# Patient Record
Sex: Female | Born: 1995 | Race: White | Hispanic: No | Smoking: Former smoker
Health system: Southern US, Community
[De-identification: ages and names within clinical notes are randomized; demographics above are authoritative.]

## PROBLEM LIST (undated history)

## (undated) HISTORY — PX: ABDOMINAL SURGERY: SHX537

---
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
## 2014-10-17 ENCOUNTER — Emergency Department: Payer: Self-pay | Admitting: Emergency Medicine

## 2015-03-25 ENCOUNTER — Encounter (HOSPITAL_BASED_OUTPATIENT_CLINIC_OR_DEPARTMENT_OTHER): Payer: Self-pay | Admitting: Emergency Medicine

## 2015-03-25 ENCOUNTER — Emergency Department (HOSPITAL_BASED_OUTPATIENT_CLINIC_OR_DEPARTMENT_OTHER)
Admission: EM | Admit: 2015-03-25 | Discharge: 2015-03-25 | Disposition: A | Discharge status: Home / Self Care | Payer: Medicaid Other | Attending: Emergency Medicine | Admitting: Emergency Medicine

## 2015-03-25 DIAGNOSIS — Z720 Tobacco use: Secondary | ICD-10-CM | POA: Diagnosis not present

## 2015-03-25 DIAGNOSIS — Z711 Person with feared health complaint in whom no diagnosis is made: Secondary | ICD-10-CM

## 2015-03-25 DIAGNOSIS — R300 Dysuria: Secondary | ICD-10-CM | POA: Diagnosis present

## 2015-03-25 DIAGNOSIS — B373 Candidiasis of vulva and vagina: Secondary | ICD-10-CM | POA: Insufficient documentation

## 2015-03-25 DIAGNOSIS — B3731 Acute candidiasis of vulva and vagina: Secondary | ICD-10-CM

## 2015-03-25 DIAGNOSIS — Z113 Encounter for screening for infections with a predominantly sexual mode of transmission: Secondary | ICD-10-CM | POA: Diagnosis not present

## 2015-03-25 LAB — URINE MICROSCOPIC-ADD ON

## 2015-03-25 LAB — WET PREP, GENITAL: Trich, Wet Prep: NONE SEEN

## 2015-03-25 LAB — URINALYSIS, ROUTINE W REFLEX MICROSCOPIC
GLUCOSE, UA: NEGATIVE mg/dL
Hgb urine dipstick: NEGATIVE
Ketones, ur: 15 mg/dL — AB
Nitrite: NEGATIVE
Protein, ur: NEGATIVE mg/dL
SPECIFIC GRAVITY, URINE: 1.028 (ref 1.005–1.030)
UROBILINOGEN UA: 1 mg/dL (ref 0.0–1.0)
pH: 6 (ref 5.0–8.0)

## 2015-03-25 LAB — PREGNANCY, URINE: PREG TEST UR: NEGATIVE

## 2015-03-25 MED ORDER — FLUCONAZOLE 50 MG PO TABS
150.0000 mg | ORAL_TABLET | Freq: Once | ORAL | Status: AC
  Administered 2015-03-25: 150 mg via ORAL
  Filled 2015-03-25 (×2): qty 1

## 2015-03-25 NOTE — ED Provider Notes (Signed)
CSN: [id number]     Arrival date & time 03/25/15  1815 History   First MD Initiated Contact with Patient 03/25/15 1935     Chief Complaint  Patient presents with  . Dysuria     (Consider location/radiation/quality/duration/timing/severity/associated sxs/prior Treatment) HPI  Pt is a [age]o female presenting to ED with c/o vaginal discharge and irritation that started about 4 days ago. Pt states discharge is thick white and "clumpy," moderate amount. States she thinks she has a yeast infection. Hx of same. Denies vaginal pain or bleeding. Denies urinary symptoms. Denies fever, chills, n/v/d. Denies abdominal pain. Pt states she would like to be tested for STDs while she is here. LMP: 02/23/15  History reviewed. No pertinent past medical history. History reviewed. No pertinent past surgical history. History reviewed. No pertinent family history. History  Substance Use Topics  . Smoking status: Current Every Day Smoker -- 0.50 packs/day    Types: Cigarettes  . Smokeless tobacco: Not on file  . Alcohol Use: No   OB History    No data available     Review of Systems  Constitutional: Negative for fever, chills, appetite change and fatigue.  Respiratory: Negative for cough and shortness of breath.   Gastrointestinal: Negative for nausea, vomiting, abdominal pain and diarrhea.  Genitourinary: Positive for vaginal discharge and vaginal pain ( "irritation"). Negative for dysuria, urgency, frequency, hematuria, flank pain, decreased urine volume, vaginal bleeding, genital sores, menstrual problem and pelvic pain.  Skin: Negative for rash.  All other systems reviewed and are negative.     Allergies  Review of patient's allergies indicates no known allergies.  Home Medications   Prior to Admission medications   Not on File   BP 121/70 mmHg  Pulse 78  Temp(Src) 97.9 F (36.6 C) (Oral)  Resp 16  Ht 4\' 11"  (1.499 m)  Wt 101 lb (45.813 kg)  BMI 20.39 kg/m2  SpO2 99%  LMP  02/23/2015 Physical Exam  Constitutional: She appears well-developed and well-nourished. No distress.  HENT:  Head: Normocephalic and atraumatic.  Eyes: Conjunctivae are normal. No scleral icterus.  Neck: Normal range of motion.  Cardiovascular: Normal rate, regular rhythm and normal heart sounds.   Pulmonary/Chest: Effort normal and breath sounds normal. No respiratory distress. She has no wheezes. She has no rales. She exhibits no tenderness.  Abdominal: Soft. Bowel sounds are normal. She exhibits no distension and no mass. There is no tenderness. There is no rebound and no guarding.  Genitourinary: There is no rash, tenderness, lesion or injury on the right labia. There is no rash, tenderness, lesion or injury on the left labia. Cervix exhibits discharge. Cervix exhibits no motion tenderness and no friability. Right adnexum displays no mass, no tenderness and no fullness. Left adnexum displays no mass, no tenderness and no fullness. No erythema, tenderness or bleeding in the vagina. No foreign body around the vagina. No signs of injury around the vagina. Vaginal discharge found.  Chaperoned exam. Normal external genitalia. Vaginal canal: moderate amount thick white-yellow discharge. No vaginal bleeding. No CMT, adnexal tenderness or masses.  Musculoskeletal: Normal range of motion.  Neurological: She is alert.  Skin: Skin is warm and dry. She is not diaphoretic.  Nursing note and vitals reviewed.   ED Course  Procedures (including critical care time) Labs Review Labs Reviewed  WET PREP, GENITAL - Abnormal; Notable for the following:    Yeast Wet Prep HPF POC MODERATE (*)    Clue Cells Wet Prep HPF POC FEW (*)  WBC, Wet Prep HPF POC TOO NUMEROUS TO COUNT (*)    All other components within normal limits  URINALYSIS, ROUTINE W REFLEX MICROSCOPIC (NOT AT [facility]) - Abnormal; Notable for the following:    APPearance CLOUDY (*)    Bilirubin Urine SMALL (*)    Ketones, ur 15 (*)     Leukocytes, UA LARGE (*)    All other components within normal limits  URINE MICROSCOPIC-ADD ON - Abnormal; Notable for the following:    Squamous Epithelial / LPF FEW (*)    Bacteria, UA FEW (*)    All other components within normal limits  URINE CULTURE  PREGNANCY, URINE  HIV ANTIBODY (ROUTINE TESTING)  RPR  GC/CHLAMYDIA PROBE AMP ([place]) NOT AT [facility]    Imaging Review No results found.   EKG Interpretation None      MDM   Final diagnoses:  Vaginal yeast infection  Concern about STD in female without diagnosis    Pt c/o vaginal discharge. Labs c/w yeast infection. Will tx with 1 time dose of diflucan in ED.  Urine culture, GC/Chlamydia, HIV and RPR tests sent off. Advised pt results should come back in 3-4 days, they may also be seen on her MyChart. Home care instructions provided. Return precautions provided. Pt verbalized understanding and agreement with tx plan.     Junius Finner, PA-C 03/26/15 1503  Nelva Nay, MD 03/31/15 2155

## 2015-03-25 NOTE — ED Notes (Signed)
Pt in with c/o likely yeast infection. States white "clumpy" discharge, no lower abdominal pain.

## 2015-03-26 LAB — GC/CHLAMYDIA PROBE AMP (~~LOC~~) NOT AT ARMC
Chlamydia: POSITIVE — AB
Neisseria Gonorrhea: NEGATIVE

## 2015-03-26 LAB — URINE CULTURE
Colony Count: NO GROWTH
Culture: NO GROWTH

## 2015-03-27 LAB — HIV ANTIBODY (ROUTINE TESTING W REFLEX): HIV Screen 4th Generation wRfx: NONREACTIVE

## 2015-03-27 LAB — RPR: RPR Ser Ql: NONREACTIVE

## 2015-03-29 ENCOUNTER — Telehealth: Payer: Self-pay | Admitting: Emergency Medicine

## 2015-03-29 NOTE — Telephone Encounter (Signed)
Post ED Visit - Positive Culture Follow-up: Successful Patient Follow-Up  Positive Chlamydia culture  [x]  Patient discharged without antimicrobial prescription and treatment is now indicated []  Organism is resistant to prescribed ED discharge antimicrobial []  Patient with positive blood cultures  Changes discussed with ED provider: Ethelda ChickJacubowitz, MD New antibiotic prescription: Zithromax one gram PO x once [phone]  Contacted patient, date 03/29/15, time 1630   Jiles HaroldGammons, Lewanda Perea Chaney 03/29/2015, 4:49 PM

## 2015-09-03 ENCOUNTER — Emergency Department
Admission: EM | Admit: 2015-09-03 | Discharge: 2015-09-03 | Disposition: A | Discharge status: Home / Self Care | Payer: Medicaid Other | Attending: Emergency Medicine | Admitting: Emergency Medicine

## 2015-09-03 ENCOUNTER — Encounter: Payer: Self-pay | Admitting: Emergency Medicine

## 2015-09-03 DIAGNOSIS — Z202 Contact with and (suspected) exposure to infections with a predominantly sexual mode of transmission: Secondary | ICD-10-CM | POA: Diagnosis not present

## 2015-09-03 DIAGNOSIS — A599 Trichomoniasis, unspecified: Secondary | ICD-10-CM | POA: Insufficient documentation

## 2015-09-03 DIAGNOSIS — Z3202 Encounter for pregnancy test, result negative: Secondary | ICD-10-CM | POA: Insufficient documentation

## 2015-09-03 DIAGNOSIS — A749 Chlamydial infection, unspecified: Secondary | ICD-10-CM | POA: Insufficient documentation

## 2015-09-03 DIAGNOSIS — N898 Other specified noninflammatory disorders of vagina: Secondary | ICD-10-CM | POA: Diagnosis present

## 2015-09-03 DIAGNOSIS — N390 Urinary tract infection, site not specified: Secondary | ICD-10-CM | POA: Insufficient documentation

## 2015-09-03 DIAGNOSIS — Z720 Tobacco use: Secondary | ICD-10-CM | POA: Diagnosis not present

## 2015-09-03 DIAGNOSIS — A549 Gonococcal infection, unspecified: Secondary | ICD-10-CM | POA: Diagnosis not present

## 2015-09-03 LAB — URINALYSIS COMPLETE WITH MICROSCOPIC (ARMC ONLY)
Bilirubin Urine: NEGATIVE
GLUCOSE, UA: NEGATIVE mg/dL
Hgb urine dipstick: NEGATIVE
Ketones, ur: NEGATIVE mg/dL
NITRITE: NEGATIVE
Protein, ur: NEGATIVE mg/dL
Specific Gravity, Urine: 1.01 (ref 1.005–1.030)
pH: 6 (ref 5.0–8.0)

## 2015-09-03 LAB — WET PREP, GENITAL
Clue Cells Wet Prep HPF POC: NONE SEEN
TRICH WET PREP: NONE SEEN

## 2015-09-03 LAB — CHLAMYDIA/NGC RT PCR (ARMC ONLY)
Chlamydia Tr: DETECTED — AB
N gonorrhoeae: DETECTED — AB

## 2015-09-03 LAB — POCT PREGNANCY, URINE: PREG TEST UR: NEGATIVE

## 2015-09-03 MED ORDER — METRONIDAZOLE 500 MG PO TABS: 2000.0000 mg | ORAL_TABLET | Freq: Once | ORAL | Status: AC

## 2015-09-03 MED ORDER — FLUCONAZOLE 150 MG PO TABS: 150.0000 mg | ORAL_TABLET | Freq: Once | ORAL | Status: DC

## 2015-09-03 MED ORDER — CEPHALEXIN 500 MG PO CAPS: 500.0000 mg | ORAL_CAPSULE | Freq: Two times a day (BID) | ORAL | Status: AC

## 2015-09-03 NOTE — Discharge Instructions (Signed)
Asymptomatic Bacteriuria, Female Asymptomatic bacteriuria is the presence of a large number of bacteria in your urine without the usual symptoms of burning or frequent urination. The following conditions increase the risk of asymptomatic bacteriuria:  Diabetes mellitus.  Advanced age.  Pregnancy in the first trimester.  Kidney stones.  Kidney transplants.  Leaky kidney tube valve in young children (reflux). Treatment for this condition is not needed in most people and can lead to other problems such as too much yeast and growth of resistant bacteria. However, some people, such as pregnant women, do need treatment to prevent kidney infection. Asymptomatic bacteriuria in pregnancy is also associated with fetal growth restriction, premature labor, and newborn death. HOME CARE INSTRUCTIONS Monitor your condition for any changes. The following actions may help to relieve any discomfort you are feeling:  Drink enough water and fluids to keep your urine clear or pale yellow. Go to the bathroom more often to keep your bladder empty.  Keep the area around your vagina and rectum clean. Wipe yourself from front to back after urinating. SEEK IMMEDIATE MEDICAL CARE IF:  You develop signs of an infection such as:  Burning with urination.  Frequency of voiding.  Back pain.  Fever.  You have blood in the urine.  You develop a fever. MAKE SURE YOU:  Understand these instructions.  Will watch your condition.  Will get help right away if you are not doing well or get worse.   This information is not intended to replace advice given to you by your health care provider. Make sure you discuss any questions you have with your health care provider.   Document Released: 10/11/2005 Document Revised: 11/01/2014 Document Reviewed: 04/02/2013 [employer] Interactive Patient Education 2016 [employer]  Take the prescription meds as directed. [facility]  [facility].

## 2015-09-03 NOTE — ED Provider Notes (Signed)
[facility] Emergency Department Provider Note ____________________________________________  Time seen: 1149  I have reviewed the triage vital signs and the nursing notes.  HISTORY  Chief Complaint  Vaginal Pain and Vaginal Discharge  HPI Vanessa Gallegos is a 19 y.o. female patient reports to the ED for evaluation and treatment of pelvic pain that is ongoing following a recent visit diaphoresis. She describes she was evaluated for abdominal pain with a pelvic ultrasound. She was confirmed to have a UTI and trichomoniasis infection. She describes being treated with Bactrim and a Pyridium pill, as well as ibuprofen. She later received a letter stating that she needed to be followed up with further treatment. She claims that she lost a letter, never called back, and subsequently relocated. She reports continued vaginal pain and itching. She denies any interim fevers, chills, sweats. She reports her LMP@10 /20/16.  History reviewed. No pertinent past medical history.  There are no active problems to display for this patient.   History reviewed. No pertinent past surgical history.  Current Outpatient Rx  Name  Route  Sig  Dispense  Refill  . cephALEXin (KEFLEX) 500 MG capsule   Oral   Take 1 capsule (500 mg total) by mouth 2 (two) times daily.   14 capsule   0   . fluconazole (DIFLUCAN) 150 MG tablet   Oral   Take 1 tablet (150 mg total) by mouth once.   1 tablet   0   . metroNIDAZOLE (FLAGYL) 500 MG tablet   Oral   Take 4 tablets (2,000 mg total) by mouth once.   4 tablet   0    Allergies Review of patient's allergies indicates no known allergies.  No family history on file.  Social History Social History  Substance Use Topics  . Smoking status: Current Every Day Smoker -- 0.50 packs/day    Types: Cigarettes  . Smokeless tobacco: None  . Alcohol Use: No   Review of Systems  Constitutional: Negative for fever. Eyes: Negative for visual  changes. ENT: Negative for sore throat. Cardiovascular: Negative for chest pain. Respiratory: Negative for shortness of breath. Gastrointestinal: Negative for abdominal pain, vomiting and diarrhea. Genitourinary: Negative for dysuria. Vaginal pain and discharge as above. Musculoskeletal: Negative for back pain. Skin: Negative for rash. Neurological: Negative for headaches, focal weakness or numbness. ____________________________________________  PHYSICAL EXAM:  VITAL SIGNS: ED Triage Vitals  Enc Vitals Group     BP 09/03/15 1106 127/92 mmHg     Pulse Rate 09/03/15 1106 74     Resp 09/03/15 1106 18     Temp 09/03/15 1106 97.5 F (36.4 C)     Temp Source 09/03/15 1106 Oral     SpO2 09/03/15 1106 98 %     Weight 09/03/15 1109 99 lb (44.906 kg)     Height 09/03/15 1106 4\' 11"  (1.499 m)     Head Cir --      Peak Flow --      Pain Score --      Pain Loc --      Pain Edu? --      Excl. in GC? --    Constitutional: Alert and oriented. Well appearing and in no distress. Head: Normocephalic and atraumatic.      Eyes: Conjunctivae are normal. PERRL. Normal extraocular movements      Ears: Canals clear. TMs intact bilaterally.   Nose: No congestion/rhinorrhea.   Mouth/Throat: Mucous membranes are moist.   Neck: Supple. No thyromegaly. Hematological/Lymphatic/Immunological: No cervical  lymphadenopathy. Cardiovascular: Normal rate, regular rhythm.  Respiratory: Normal respiratory effort. No wheezes/rales/rhonchi. Gastrointestinal: Soft and nontender. No distention. GU: normal external genitalia. Patient with scant, white discharge in the vault. Cervical nabothian cysts noted. Patient with tenderness to cervical manipulation. No adnexal masses palpated.  Musculoskeletal: Nontender with normal range of motion in all extremities.  Neurologic:  Normal gait without ataxia. Normal speech and language. No gross focal neurologic deficits are appreciated. Skin:  Skin is warm, dry and  intact. No rash noted. Psychiatric: Mood and affect are normal. Patient exhibits appropriate insight and judgment. ____________________________________________   LABS (pertinent positives/negatives) Labs Reviewed  WET PREP, GENITAL - Abnormal; Notable for the following:    Yeast Wet Prep HPF POC FEW (*)    WBC, Wet Prep HPF POC MANY (*)    All other components within normal limits  CHLAMYDIA/NGC RT PCR ([facility] ONLY) - Abnormal; Notable for the following:    Chlamydia Tr DETECTED (*)    N gonorrhoeae DETECTED (*)    All other components within normal limits  URINALYSIS COMPLETEWITH MICROSCOPIC ([facility] ONLY) - Abnormal; Notable for the following:    Color, Urine YELLOW (*)    APPearance HAZY (*)    Leukocytes, UA 1+ (*)    Bacteria, UA RARE (*)    Squamous Epithelial / LPF 0-5 (*)    All other components within normal limits  POC URINE PREG, ED  POCT PREGNANCY, URINE  ____________________________________________  INITIAL IMPRESSION / ASSESSMENT AND PLAN / ED COURSE  Patient discharged with prescriptions for Diflucan for yeast infection confirmed on wet prep. She is also provided with prescription for Flagyl for previously untreated trichomoniasis. She is provided with the Elease Hashimoto for Keflex to dose for a confirmed asymptomatic UTI. She'll be notified via phone pending the results of her still processing GC swab.  ----------------------------------------- 5:56 PM on 09/03/2015 ----------------------------------------- Left message with phone number on record to call the ED for information. Message left with patient's female visitor from the earlier ED visit.  ----------------------------------------- 6:07 PM on 09/03/2015 ----------------------------------------- Spoke with patient to notify her of confirmed results of gonorrhea and chlamydia tests. Patient will be prescribed azithromycin and doxycycline called in to the [facility] in [facility] as requested. Patient is  encouraged to take the medicines as directed until completed. She is also encouraged to avoid any intimate contact until medicines are completed, symptoms are resolved, and test of cure are performed. She is also encouraged notify any sexual partners of her positive test results, so that they may be treated empirically. She verbalizes understanding and will follow-up as directed. ____________________________________________  FINAL CLINICAL IMPRESSION(S) / ED DIAGNOSES  Final diagnoses:  UTI (lower urinary tract infection)  STD exposure  Trichomonas contact, untreated  Gonorrhea in female  Chlamydia      Lissa Hoard, PA-C 09/03/15 1825  Richardean Canal, MD 09/04/15 [phone]

## 2015-09-03 NOTE — ED Notes (Signed)
Pt recently treated for trich and has finished antibiotics but symptoms still persist. Vaginal pain and itching still present. Pt states she was given bactrim for std.

## 2015-09-03 NOTE — ED Notes (Signed)
abd pain past several weeks, saw md got letter  Saying she needed to go back for treatment, still has abd pain,

## 2015-09-04 ENCOUNTER — Telehealth: Payer: Self-pay | Admitting: Emergency Medicine

## 2015-09-04 NOTE — ED Notes (Signed)
Called patient to inform of positive gonorrhea and chlamydia tests and that she needs treatment.  Would like her to call [facility] at [phone] for appointment for free treatment.  Person who answered said pt was not home, and he took my number to have pt call me back.

## 2016-09-26 ENCOUNTER — Emergency Department
Admission: EM | Admit: 2016-09-26 | Discharge: 2016-09-26 | Disposition: A | Discharge status: Home / Self Care | Payer: Medicaid Other | Attending: Emergency Medicine | Admitting: Emergency Medicine

## 2016-09-26 ENCOUNTER — Encounter: Payer: Self-pay | Admitting: Emergency Medicine

## 2016-09-26 DIAGNOSIS — F1290 Cannabis use, unspecified, uncomplicated: Secondary | ICD-10-CM | POA: Insufficient documentation

## 2016-09-26 DIAGNOSIS — N390 Urinary tract infection, site not specified: Secondary | ICD-10-CM | POA: Insufficient documentation

## 2016-09-26 DIAGNOSIS — N760 Acute vaginitis: Secondary | ICD-10-CM | POA: Insufficient documentation

## 2016-09-26 DIAGNOSIS — M545 Low back pain, unspecified: Secondary | ICD-10-CM

## 2016-09-26 DIAGNOSIS — F17210 Nicotine dependence, cigarettes, uncomplicated: Secondary | ICD-10-CM | POA: Insufficient documentation

## 2016-09-26 DIAGNOSIS — B9689 Other specified bacterial agents as the cause of diseases classified elsewhere: Secondary | ICD-10-CM

## 2016-09-26 LAB — WET PREP, GENITAL
Sperm: NONE SEEN
TRICH WET PREP: NONE SEEN
Yeast Wet Prep HPF POC: NONE SEEN

## 2016-09-26 LAB — URINALYSIS COMPLETE WITH MICROSCOPIC (ARMC ONLY)
Bilirubin Urine: NEGATIVE
Glucose, UA: NEGATIVE mg/dL
Hgb urine dipstick: NEGATIVE
Nitrite: NEGATIVE
PH: 6 (ref 5.0–8.0)
PROTEIN: 30 mg/dL — AB
SPECIFIC GRAVITY, URINE: 1.013 (ref 1.005–1.030)

## 2016-09-26 LAB — CHLAMYDIA/NGC RT PCR (ARMC ONLY)
CHLAMYDIA TR: NOT DETECTED
N gonorrhoeae: NOT DETECTED

## 2016-09-26 LAB — POCT PREGNANCY, URINE: PREG TEST UR: NEGATIVE

## 2016-09-26 MED ORDER — CEPHALEXIN 500 MG PO CAPS
500.0000 mg | ORAL_CAPSULE | Freq: Once | ORAL | Status: AC
  Administered 2016-09-26: 500 mg via ORAL
  Filled 2016-09-26: qty 1

## 2016-09-26 MED ORDER — DICLOFENAC SODIUM 3 % TD GEL: 1.0000 "application " | Freq: Two times a day (BID) | TRANSDERMAL | 0 refills | Status: AC | PRN

## 2016-09-26 MED ORDER — METRONIDAZOLE 500 MG PO TABS
500.0000 mg | ORAL_TABLET | Freq: Once | ORAL | Status: AC
  Administered 2016-09-26: 500 mg via ORAL
  Filled 2016-09-26: qty 1

## 2016-09-26 MED ORDER — CEPHALEXIN 500 MG PO CAPS: 500.0000 mg | ORAL_CAPSULE | Freq: Three times a day (TID) | ORAL | 0 refills | Status: AC

## 2016-09-26 MED ORDER — METRONIDAZOLE 500 MG PO TABS: 500.0000 mg | ORAL_TABLET | Freq: Two times a day (BID) | ORAL | 0 refills | Status: AC

## 2016-09-26 NOTE — ED Triage Notes (Signed)
Pt c/o pain across mid back x 2 days; says she was drinking heavily yesterday and didn't pay any attention to the pain; pain now constant; feels at times like she's having trouble voiding; denies fever; denies dysuria;

## 2016-09-26 NOTE — ED Notes (Signed)
Pt reports hx of multiple UTI

## 2016-09-26 NOTE — ED Provider Notes (Signed)
[facility] Emergency Department Provider Note  ____________________________________________   First MD Initiated Contact with Patient 09/26/16 0300     (approximate)  I have reviewed the triage vital signs and the nursing notes.   HISTORY  Chief Complaint Back Pain   HPI Vanessa Gallegos is a 20 y.o. female with a history of gonorrhea, chlamydia as well as Trichomonas was present to emergency department with 3 days of lower back pain. She says the pain is worse with movement and has been worsening over the past 3 days. Despite the triage note the patient denies any burning with urination. Denies any vaginal bleeding or discharge. Says that she has had UTIs in the past and that this feels differently. Says that the pain does worsen with walking. Denies any nausea vomiting or diarrhea. Denies any abdominal pain. Does not report any radiation of the pain to the lower extremities. Patient says that she is concerned that she could have an STD causing his symptoms.   History reviewed. No pertinent past medical history.  There are no active problems to display for this patient.   History reviewed. No pertinent surgical history.  Prior to Admission medications   Not on File    Allergies Patient has no known allergies.  History reviewed. No pertinent family history.  Social History Social History  Substance Use Topics  . Smoking status: Current Every Day Smoker    Packs/day: 0.50    Types: Cigarettes  . Smokeless tobacco: Never Used  . Alcohol use Yes    Review of Systems Constitutional: No fever/chills Eyes: No visual changes. ENT: No sore throat. Cardiovascular: Denies chest pain. Respiratory: Denies shortness of breath. Gastrointestinal: No abdominal pain.  No nausea, no vomiting.  No diarrhea.  No constipation. Genitourinary: Negative for dysuria. Musculoskeletal: Negative for back pain. Skin: Negative for rash. Neurological: Negative for  headaches, focal weakness or numbness.  10-point ROS otherwise negative.  ____________________________________________   PHYSICAL EXAM:  VITAL SIGNS: ED Triage Vitals  Enc Vitals Group     BP 09/26/16 0110 114/86     Pulse Rate 09/26/16 0110 (!) 104     Resp 09/26/16 0110 18     Temp 09/26/16 0110 98 F (36.7 C)     Temp Source 09/26/16 0110 Oral     SpO2 09/26/16 0110 97 %     Weight 09/26/16 0111 111 lb (50.3 kg)     Height 09/26/16 0111 4\' 11"  (1.499 m)     Head Circumference --      Peak Flow --      Pain Score 09/26/16 0111 7     Pain Loc --      Pain Edu? --      Excl. in GC? --     Constitutional: Alert and oriented. Well appearing and in no acute distress. Eyes: Conjunctivae are normal. PERRL. EOMI. Head: Atraumatic. Nose: No congestion/rhinnorhea. Mouth/Throat: Mucous membranes are moist.  Neck: No stridor.   Cardiovascular: Normal rate, regular rhythm. Grossly normal heart sounds.   Respiratory: Normal respiratory effort.  No retractions. Lungs CTAB. Gastrointestinal: Soft and nontender. No distention. Mild bilateral CVA tenderness palpation. Genitourinary:  Normal external exam. Speculum exam with a small amount of white discharge. Bimanual exam without any cervical motion tenderness. No uterine or adnexal tenderness nor masses. Musculoskeletal: No lower extremity tenderness nor edema.  No joint effusions. Neurologic:  Normal speech and language. No gross focal neurologic deficits are appreciated. No gait instability. Skin:  Skin is  warm, dry and intact. No rash noted. Psychiatric: Mood and affect are normal. Speech and behavior are normal.  ____________________________________________   LABS (all labs ordered are listed, but only abnormal results are displayed)  Labs Reviewed  URINALYSIS COMPLETEWITH MICROSCOPIC ([facility] ONLY) - Abnormal; Notable for the following:       Result Value   Color, Urine YELLOW (*)    APPearance HAZY (*)    Ketones, ur TRACE  (*)    Protein, ur 30 (*)    Leukocytes, UA 1+ (*)    Bacteria, UA FEW (*)    Squamous Epithelial / LPF 6-30 (*)    All other components within normal limits  WET PREP, GENITAL  CHLAMYDIA/NGC RT PCR ([facility] ONLY)  POCT PREGNANCY, URINE  POC URINE PREG, ED   ____________________________________________  EKG   ____________________________________________  RADIOLOGY   ____________________________________________   PROCEDURES  Procedure(s) performed:   Procedures  Critical Care performed:   ____________________________________________   INITIAL IMPRESSION / ASSESSMENT AND PLAN / ED COURSE  Pertinent labs & imaging results that were available during my care of the patient were reviewed by me and considered in my medical decision making (see chart for details).    Clinical Course   ----------------------------------------- 6:16 AM on 09/26/2016 -----------------------------------------  Patient resting comfortable at this time. Multiple possible causes of her back pain.  Possible mild pile of. Also found to have bacterial vaginosis. Discussed diagnosis as well as treatment plan with patient. She is understanding what to comply. Also discussed not drinking well and Flagyl and she is understanding willing to comply.   ____________________________________________   FINAL CLINICAL IMPRESSION(S) / ED DIAGNOSES  UTI. Bacterial vaginosis. Low back pain.    NEW MEDICATIONS STARTED DURING THIS VISIT:  New Prescriptions   No medications on file     Note:  This document was prepared using Dragon voice recognition software and may include unintentional dictation errors.    Myrna Blazeravid Matthew Briannia Laba, MD 09/26/16 [phone]

## 2016-09-26 NOTE — ED Notes (Signed)
Pt c/o mid back pain X 3 days. Pt reports decrease in urinary frequency. Pt reports drinking heavily yesterday, and consequently ignoring pain. Pt reports she awoke again today with back pain.

## 2016-09-26 NOTE — ED Notes (Signed)
Pt tender to palpation of right and left flanks

## 2016-09-30 LAB — URINE CULTURE

## 2016-10-01 NOTE — Progress Notes (Signed)
ED Culture Results   Allergies: NKDA Visit Date: 09/26/16 Chief Complaint: back pain Culture Type: urine  Culture Results: coagulase negative  Original Abx given: metronidazole and cephalexin Original Abx sensitive, intermediate, or resistant: sensitive Recommended Abx: none  Patient on appropriate antibiotics.   Delsa BernKelly m Jaquelyne Firkus, PharmD 7:53 PM 10/01/2016

## 2016-11-26 ENCOUNTER — Encounter: Payer: Self-pay | Admitting: Emergency Medicine

## 2016-11-26 ENCOUNTER — Emergency Department
Admission: EM | Admit: 2016-11-26 | Discharge: 2016-11-26 | Disposition: A | Discharge status: Home / Self Care | Payer: Self-pay | Attending: Emergency Medicine | Admitting: Emergency Medicine

## 2016-11-26 ENCOUNTER — Emergency Department: Payer: Self-pay

## 2016-11-26 DIAGNOSIS — F17210 Nicotine dependence, cigarettes, uncomplicated: Secondary | ICD-10-CM | POA: Insufficient documentation

## 2016-11-26 DIAGNOSIS — J40 Bronchitis, not specified as acute or chronic: Secondary | ICD-10-CM | POA: Insufficient documentation

## 2016-11-26 MED ORDER — PSEUDOEPH-BROMPHEN-DM 30-2-10 MG/5ML PO SYRP: 10.0000 mL | ORAL_SOLUTION | Freq: Four times a day (QID) | ORAL | 0 refills | Status: DC | PRN

## 2016-11-26 MED ORDER — PREDNISONE 50 MG PO TABS: 50.0000 mg | ORAL_TABLET | Freq: Every day | ORAL | 0 refills | Status: DC

## 2016-11-26 MED ORDER — ALBUTEROL SULFATE HFA 108 (90 BASE) MCG/ACT IN AERS: 2.0000 | INHALATION_SPRAY | RESPIRATORY_TRACT | 0 refills | Status: AC | PRN

## 2016-11-26 NOTE — ED Triage Notes (Signed)
Pt with productive cough for one week and now has chest pain when taking a deep breath or moving her arm a certain way.

## 2016-11-26 NOTE — ED Provider Notes (Signed)
[facility] Emergency Department Provider Note  ____________________________________________  Time seen: Approximately 9:11 PM  I have reviewed the triage vital signs and the nursing notes.   HISTORY  Chief Complaint Cough    HPI Vanessa Gallegos is a 21 y.o. female who presents to emergency department complaining of coughing, chest pain. Patient reports that over the past week she has had increasing coughing with a burning/sharp chest pain while coughing. She denies any cardiac history, any familial history of cardiac issues. Patient is a smoker. She is not trying medications prior to arrival. She denies any fevers or chills, nasal congestion,sore throat. No other components of this time.   History reviewed. No pertinent past medical history.  There are no active problems to display for this patient.   History reviewed. No pertinent surgical history.  Prior to Admission medications   Medication Sig Start Date End Date Taking? Authorizing Provider  albuterol (PROVENTIL HFA;VENTOLIN HFA) 108 (90 Base) MCG/ACT inhaler Inhale 2 puffs into the lungs every 4 (four) hours as needed for wheezing or shortness of breath. 11/26/16   Delorise Royals Sanskriti Greenlaw, PA-C  brompheniramine-pseudoephedrine-DM 30-2-10 MG/5ML syrup Take 10 mLs by mouth 4 (four) times daily as needed. 11/26/16   Delorise Royals Montez Stryker, PA-C  predniSONE (DELTASONE) 50 MG tablet Take 1 tablet (50 mg total) by mouth daily with breakfast. 11/26/16   Delorise Royals Alijah Hyde, PA-C    Allergies Patient has no known allergies.  No family history on file.  Social History Social History  Substance Use Topics  . Smoking status: Current Every Day Smoker    Packs/day: 0.50    Types: Cigarettes  . Smokeless tobacco: Never Used  . Alcohol use Yes     Review of Systems  Constitutional: No fever/chills Eyes: No visual changes. No discharge ENT: No upper respiratory complaints. Cardiovascular: Positive chest pain  with coughing Respiratory:Positive cough. No SOB. Gastrointestinal: No abdominal pain.  No nausea, no vomiting.  Musculoskeletal: Negative for musculoskeletal pain. Skin: Negative for rash, abrasions, lacerations, ecchymosis. Neurological: Negative for headaches, focal weakness or numbness. 10-point ROS otherwise negative.  ____________________________________________   PHYSICAL EXAM:  VITAL SIGNS: ED Triage Vitals  Enc Vitals Group     BP 11/26/16 1853 124/67     Pulse Rate 11/26/16 1853 (!) 105     Resp 11/26/16 1853 18     Temp 11/26/16 1853 98.3 F (36.8 C)     Temp Source 11/26/16 1853 Oral     SpO2 11/26/16 1853 99 %     Weight 11/26/16 1849 111 lb (50.3 kg)     Height --      Head Circumference --      Peak Flow --      Pain Score --      Pain Loc --      Pain Edu? --      Excl. in GC? --      Constitutional: Alert and oriented. Well appearing and in no acute distress. Eyes: Conjunctivae are normal. PERRL. EOMI. Head: Atraumatic. ENT:      Ears: EACs and TMs unremarkable      Nose: No congestion/rhinnorhea.      Mouth/Throat: Mucous membranes are moist.  Neck: No stridor.   Hematological/Lymphatic/Immunilogical: No cervical lymphadenopathy. Cardiovascular: Normal rate, regular rhythm. Normal S1 and S2.  Good peripheral circulation. Respiratory: Normal respiratory effort without tachypnea or retractions. Lungs with a few scattered wheezes. No rales or rhonchi.Peri Jefferson air entry to the bases with no decreased or  absent breath sounds. Musculoskeletal: Full range of motion to all extremities. No gross deformities appreciated. Neurologic:  Normal speech and language. No gross focal neurologic deficits are appreciated.  Skin:  Skin is warm, dry and intact. No rash noted. Psychiatric: Mood and affect are normal. Speech and behavior are normal. Patient exhibits appropriate insight and judgement.   ____________________________________________   LABS (all labs ordered  are listed, but only abnormal results are displayed)  Labs Reviewed - No data to display ____________________________________________  EKG  EKG reveals sinus tachycardia rate 105 bpm. No ST elevations or depressions noted. PR, QRS, QT interval within normal limits. Normal axis. No Q waves are delta waves identified.  ____________________________________________  RADIOLOGY Festus BarrenI, Marcey Persad D Ulyssa Walthour, personally viewed and evaluated these images (plain radiographs) as part of my medical decision making, as well as reviewing the written report by the radiologist.  Dg Chest 2 View  Result Date: 11/26/2016 CLINICAL DATA:  Initial evaluation for productive cough for 1 week, chest pain. EXAM: CHEST  2 VIEW COMPARISON:  None available. FINDINGS: The heart size and mediastinal contours are within normal limits. Both lungs are clear. The visualized skeletal structures are unremarkable. IMPRESSION: No active cardiopulmonary disease. Electronically Signed   By: Rise MuBenjamin  McClintock M.D.   On: 11/26/2016 20:55    ____________________________________________    PROCEDURES  Procedure(s) performed:    Procedures    Medications - No data to display   ____________________________________________   INITIAL IMPRESSION / ASSESSMENT AND PLAN / ED COURSE  Pertinent labs & imaging results that were available during my care of the patient were reviewed by me and considered in my medical decision making (see chart for details).  Review of the [place] CSRS was performed in accordance of the [facility] prior to dispensing any controlled drugs.     Patient's diagnosis is consistent with bronchitis. X-ray reveals no acute consolidation consistent with pneumonia. EKG is reassuring with no indications of acute cardiac event.. Patient will be discharged home with prescriptions for steroids, albuterol, cough syrup. Patient is to follow up with primary care as needed or otherwise directed. Patient is given ED  precautions to return to the ED for any worsening or new symptoms.     ____________________________________________  FINAL CLINICAL IMPRESSION(S) / ED DIAGNOSES  Final diagnoses:  Bronchitis      NEW MEDICATIONS STARTED DURING THIS VISIT:  Discharge Medication List as of 11/26/2016  9:18 PM    START taking these medications   Details  albuterol (PROVENTIL HFA;VENTOLIN HFA) 108 (90 Base) MCG/ACT inhaler Inhale 2 puffs into the lungs every 4 (four) hours as needed for wheezing or shortness of breath., Starting Fri 11/26/2016, Print    brompheniramine-pseudoephedrine-DM 30-2-10 MG/5ML syrup Take 10 mLs by mouth 4 (four) times daily as needed., Starting Fri 11/26/2016, Print    predniSONE (DELTASONE) 50 MG tablet Take 1 tablet (50 mg total) by mouth daily with breakfast., Starting Fri 11/26/2016, Print            This chart was dictated using voice recognition software/Dragon. Despite best efforts to proofread, errors can occur which can change the meaning. Any change was purely unintentional.    Racheal PatchesJonathan D Lillieanna Tuohy, PA-C 11/26/16 2142    Rockne MenghiniAnne-Caroline Norman, MD 12/01/16 2223

## 2019-11-13 ENCOUNTER — Ambulatory Visit: Payer: Medicaid Other | Attending: Internal Medicine

## 2019-11-13 DIAGNOSIS — Z20822 Contact with and (suspected) exposure to covid-19: Secondary | ICD-10-CM

## 2019-11-14 LAB — NOVEL CORONAVIRUS, NAA: SARS-CoV-2, NAA: NOT DETECTED

## 2019-11-16 ENCOUNTER — Telehealth: Payer: Self-pay | Admitting: General Practice

## 2019-11-16 NOTE — Telephone Encounter (Signed)
Patient informed of negative covid-19 result. Patient verbalized understanding.  °

## 2021-01-08 ENCOUNTER — Emergency Department
Admission: EM | Admit: 2021-01-08 | Discharge: 2021-01-08 | Disposition: A | Discharge status: Home / Self Care | Payer: No Typology Code available for payment source | Attending: Emergency Medicine | Admitting: Emergency Medicine

## 2021-01-08 ENCOUNTER — Emergency Department: Payer: No Typology Code available for payment source

## 2021-01-08 ENCOUNTER — Other Ambulatory Visit: Payer: Self-pay

## 2021-01-08 DIAGNOSIS — Y999 Unspecified external cause status: Secondary | ICD-10-CM | POA: Insufficient documentation

## 2021-01-08 DIAGNOSIS — W2211XA Striking against or struck by driver side automobile airbag, initial encounter: Secondary | ICD-10-CM | POA: Insufficient documentation

## 2021-01-08 DIAGNOSIS — Y9389 Activity, other specified: Secondary | ICD-10-CM | POA: Insufficient documentation

## 2021-01-08 DIAGNOSIS — M79632 Pain in left forearm: Secondary | ICD-10-CM | POA: Insufficient documentation

## 2021-01-08 DIAGNOSIS — Y92410 Unspecified street and highway as the place of occurrence of the external cause: Secondary | ICD-10-CM | POA: Diagnosis not present

## 2021-01-08 DIAGNOSIS — F17210 Nicotine dependence, cigarettes, uncomplicated: Secondary | ICD-10-CM | POA: Diagnosis not present

## 2021-01-08 MED ORDER — MELOXICAM 15 MG PO TABS: 15.0000 mg | ORAL_TABLET | Freq: Every day | ORAL | 2 refills | Status: AC

## 2021-01-08 MED ORDER — MELOXICAM 15 MG PO TABS: 15.0000 mg | ORAL_TABLET | Freq: Every day | ORAL | 2 refills | Status: DC

## 2021-01-08 MED ORDER — OXYCODONE-ACETAMINOPHEN 5-325 MG PO TABS
1.0000 | ORAL_TABLET | Freq: Once | ORAL | Status: AC
  Administered 2021-01-08: 1 via ORAL
  Filled 2021-01-08: qty 1

## 2021-01-08 MED ORDER — ONDANSETRON 4 MG PO TBDP
4.0000 mg | ORAL_TABLET | Freq: Once | ORAL | Status: AC
  Administered 2021-01-08: 4 mg via ORAL
  Filled 2021-01-08: qty 1
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
## 2021-01-08 NOTE — ED Notes (Signed)
Ice pack applied to left arm at this time.
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
## 2022-08-30 IMAGING — DX DG FOREARM 2V*L*
2 series · 2 of 2 positions shown · non-contrast
Comparison: None.

CLINICAL DATA: Forearm pain

EXAM:
LEFT FOREARM - 2 VIEW

[forearm ap]
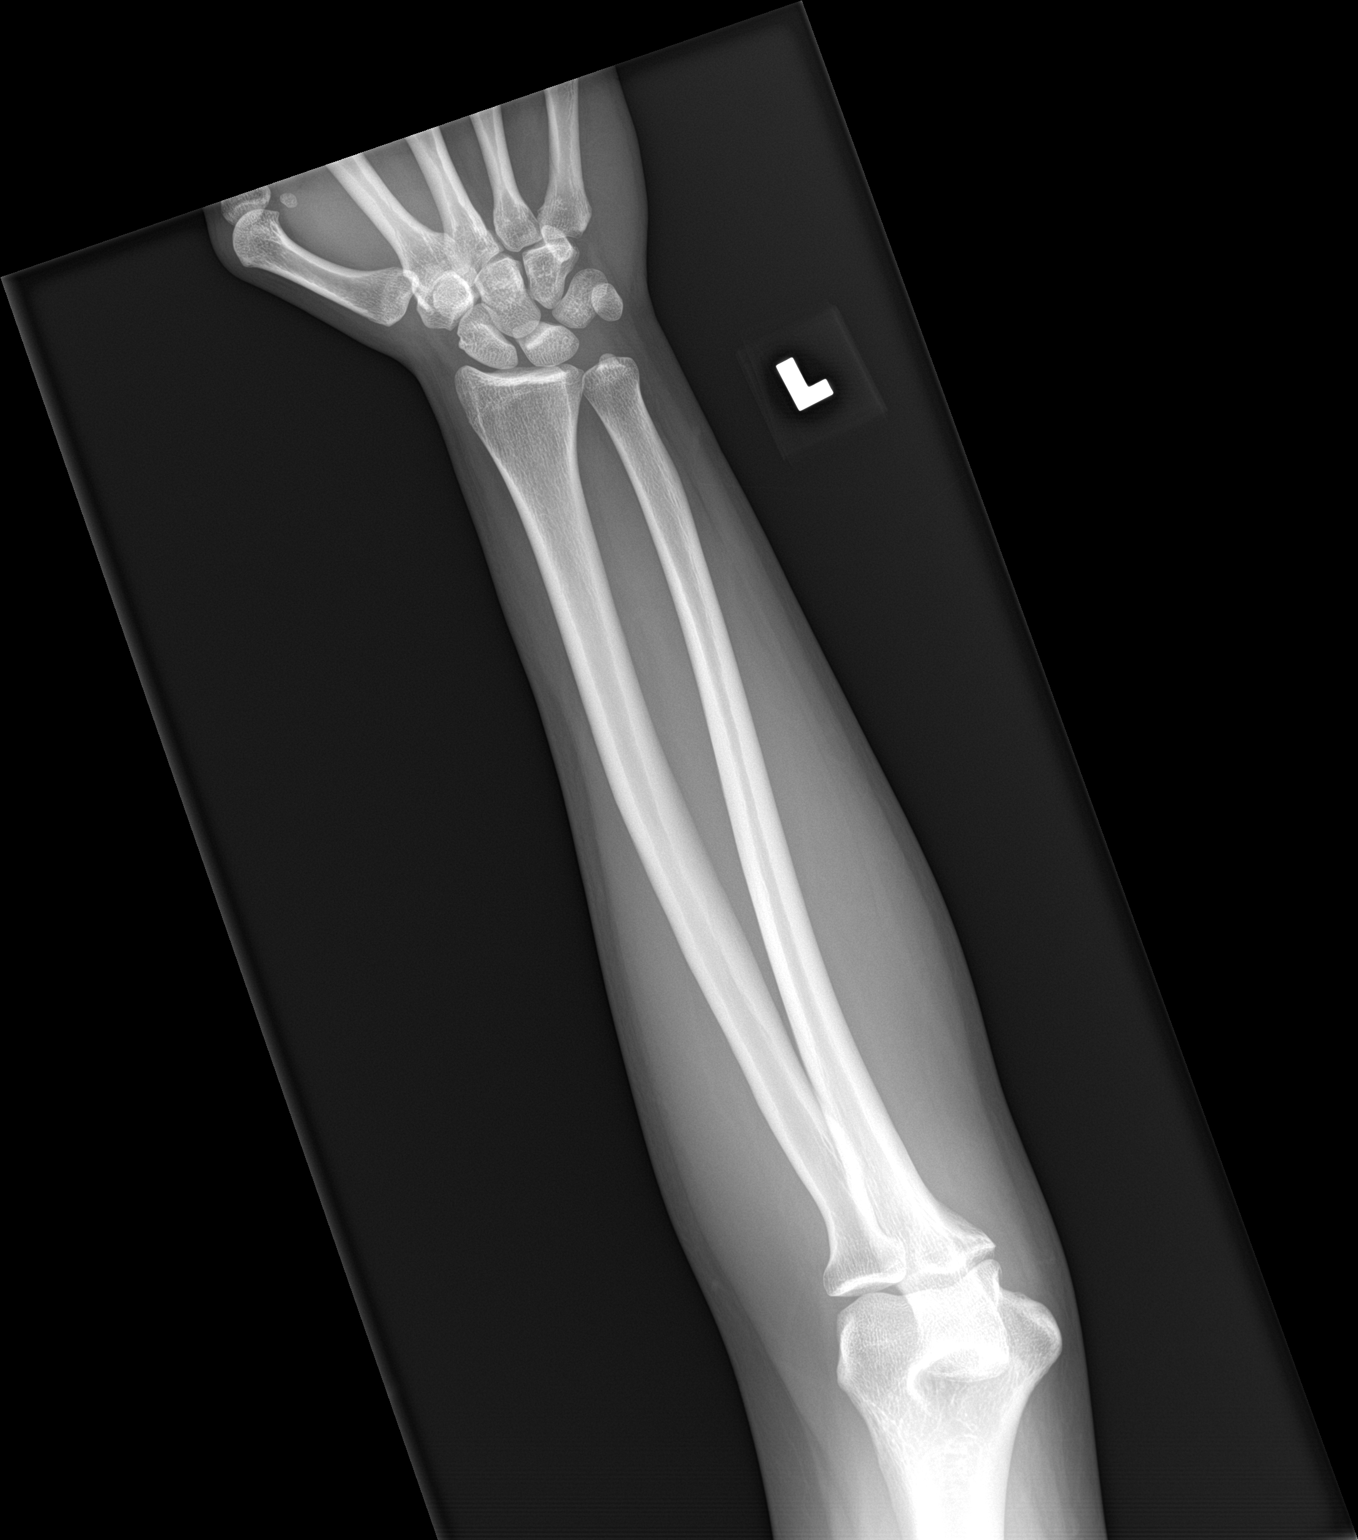

[forearm lat]
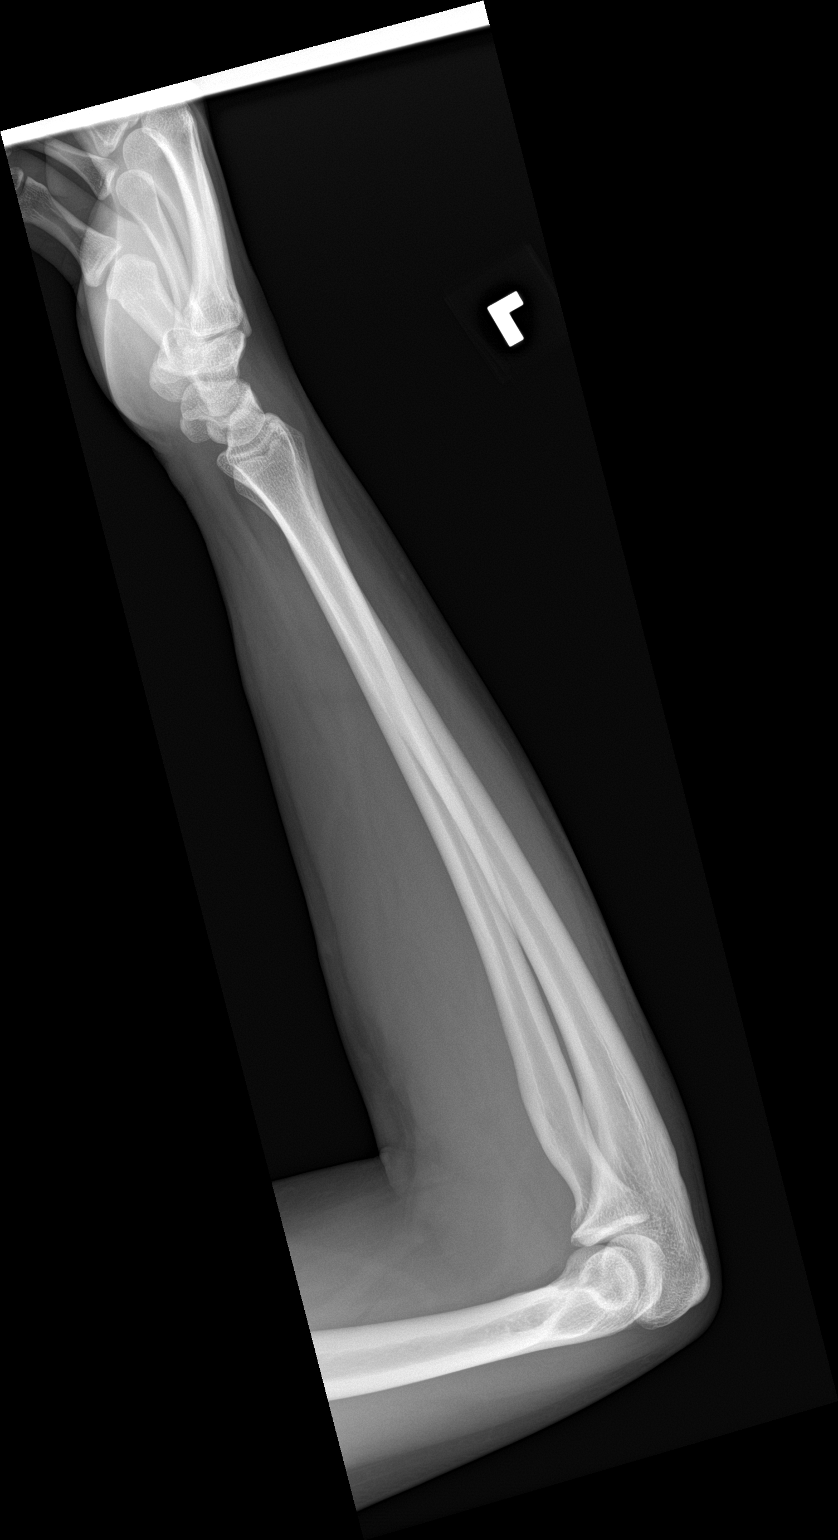

[2 of 2 positions shown; findings below may reference images not displayed]

FINDINGS: There is no evidence of fracture or other focal bone lesions. Soft
tissues are unremarkable.
IMPRESSION: Negative.

## 2022-12-01 ENCOUNTER — Ambulatory Visit
Admission: EM | Admit: 2022-12-01 | Discharge: 2022-12-01 | Disposition: A | Discharge status: Home / Self Care | Payer: Medicaid Other | Attending: Physician Assistant | Admitting: Physician Assistant

## 2022-12-01 DIAGNOSIS — N898 Other specified noninflammatory disorders of vagina: Secondary | ICD-10-CM | POA: Diagnosis present

## 2022-12-01 NOTE — ED Provider Notes (Signed)
[facility]    CSN: [id number] Arrival date & time: 12/01/22  6948      History   Chief Complaint Chief Complaint  Patient presents with   Vaginal Discharge    HPI Vanessa Gallegos is a 27 y.o. female.   Patient here today for evaluation of clear vaginal discharge that started about a week ago.  She states that there has been some mild smell associated with discharge.  She denies any other symptoms including genital lesions or rash.  She has not had any STD exposures.  She does not report treatment for symptoms.  The history is provided by the patient.  Vaginal Discharge Associated symptoms: no abdominal pain, no dysuria, no fever, no nausea and no vomiting     History reviewed. No pertinent past medical history.  There are no problems to display for this patient.   History reviewed. No pertinent surgical history.  OB History   No obstetric history on file.      Home Medications    Prior to Admission medications   Medication Sig Start Date End Date Taking? Authorizing Provider  albuterol (PROVENTIL HFA;VENTOLIN HFA) 108 (90 Base) MCG/ACT inhaler Inhale 2 puffs into the lungs every 4 (four) hours as needed for wheezing or shortness of breath. 11/26/16   Cuthriell, Charline Bills, PA-C    Family History History reviewed. No pertinent family history.  Social History Social History   Tobacco Use   Smoking status: Every Day    Packs/day: 0.50    Types: Cigarettes   Smokeless tobacco: Never  Substance Use Topics   Alcohol use: Yes   Drug use: Yes    Types: Marijuana     Allergies   Patient has no known allergies.   Review of Systems Review of Systems  Constitutional:  Negative for chills and fever.  Eyes:  Negative for discharge and redness.  Gastrointestinal:  Negative for abdominal pain, nausea and vomiting.  Genitourinary:  Positive for vaginal discharge. Negative for dysuria.  Musculoskeletal:  Negative for back pain.      Physical Exam Triage Vital Signs ED Triage Vitals [12/01/22 0855]  Enc Vitals Group     BP (!) 145/96     Pulse Rate 74     Resp 16     Temp 98.2 F (36.8 C)     Temp Source Oral     SpO2 97 %     Weight      Height      Head Circumference      Peak Flow      Pain Score 0     Pain Loc      Pain Edu?      Excl. in [place]?    No data found.  Updated Vital Signs BP (!) 145/96 (BP Location: Right Arm)   Pulse 74   Temp 98.2 F (36.8 C) (Oral)   Resp 16   SpO2 97%      Physical Exam Vitals and nursing note reviewed.  Constitutional:      General: She is not in acute distress.    Appearance: Normal appearance. She is not ill-appearing.  HENT:     Head: Normocephalic and atraumatic.  Eyes:     Conjunctiva/sclera: Conjunctivae normal.  Cardiovascular:     Rate and Rhythm: Normal rate.  Pulmonary:     Effort: Pulmonary effort is normal. No respiratory distress.  Neurological:     Mental Status: She is alert.  Psychiatric:  Mood and Affect: Mood normal.        Behavior: Behavior normal.        Thought Content: Thought content normal.      UC Treatments / Results  Labs (all labs ordered are listed, but only abnormal results are displayed) Labs Reviewed  CERVICOVAGINAL ANCILLARY ONLY    EKG   Radiology No results found.  Procedures Procedures (including critical care time)  Medications Ordered in UC Medications - No data to display  Initial Impression / Assessment and Plan / UC Course  I have reviewed the triage vital signs and the nursing notes.  Pertinent labs & imaging results that were available during my care of the patient were reviewed by me and considered in my medical decision making (see chart for details).    STD screening ordered. Will await results for further recommendation. Encouraged follow up with any further concerns.   Final Clinical Impressions(s) / UC Diagnoses   Final diagnoses:  Vaginal discharge   Discharge  Instructions   None    ED Prescriptions   None    PDMP not reviewed this encounter.   Francene Finders, PA-C 12/01/22 1606

## 2022-12-01 NOTE — ED Triage Notes (Signed)
Pt c/o vaginal clear discharge with odor onset ~ 1 week ago concerned for BV.

## 2022-12-03 LAB — CERVICOVAGINAL ANCILLARY ONLY
Bacterial Vaginitis (gardnerella): POSITIVE — AB
Candida Glabrata: NEGATIVE
Candida Vaginitis: NEGATIVE
Chlamydia: NEGATIVE
Comment: NEGATIVE
Comment: NEGATIVE
Comment: NEGATIVE
Comment: NEGATIVE
Comment: NEGATIVE
Comment: NORMAL
Neisseria Gonorrhea: NEGATIVE
Trichomonas: POSITIVE — AB

## 2022-12-04 ENCOUNTER — Telehealth (HOSPITAL_COMMUNITY): Payer: Self-pay | Admitting: Emergency Medicine

## 2022-12-04 MED ORDER — METRONIDAZOLE 500 MG PO TABS: 500.0000 mg | ORAL_TABLET | Freq: Two times a day (BID) | ORAL | 0 refills | Status: DC

## 2023-06-05 ENCOUNTER — Ambulatory Visit
Admission: EM | Admit: 2023-06-05 | Discharge: 2023-06-05 | Disposition: A | Discharge status: Home / Self Care | Payer: Medicaid Other | Attending: Internal Medicine | Admitting: Internal Medicine

## 2023-06-05 ENCOUNTER — Encounter: Payer: Self-pay | Admitting: Emergency Medicine

## 2023-06-05 DIAGNOSIS — J029 Acute pharyngitis, unspecified: Secondary | ICD-10-CM | POA: Diagnosis not present

## 2023-06-05 DIAGNOSIS — H6593 Unspecified nonsuppurative otitis media, bilateral: Secondary | ICD-10-CM | POA: Diagnosis not present

## 2023-06-05 DIAGNOSIS — J0100 Acute maxillary sinusitis, unspecified: Secondary | ICD-10-CM | POA: Insufficient documentation

## 2023-06-05 LAB — POCT RAPID STREP A (OFFICE): Rapid Strep A Screen: NEGATIVE

## 2023-06-05 MED ORDER — AMOXICILLIN-POT CLAVULANATE 875-125 MG PO TABS: 1.0000 | ORAL_TABLET | Freq: Two times a day (BID) | ORAL | 0 refills | Status: AC

## 2023-06-05 MED ORDER — PREDNISONE 20 MG PO TABS: 40.0000 mg | ORAL_TABLET | Freq: Every day | ORAL | 0 refills | Status: AC

## 2023-06-05 NOTE — ED Triage Notes (Signed)
Patient c/o right sided facial swelling, more swelling around right ear and throat x 3 days.  Pain when swallowing.  Denies taken any Tylenol or Ibuprofen.  Using cough drops.

## 2023-06-05 NOTE — ED Provider Notes (Addendum)
[facility]    CSN: [id number] Arrival date & time: 06/05/23  0806      History   Chief Complaint Chief Complaint  Patient presents with   Sore Throat    HPI Vanessa Gallegos is a 27 y.o. female.   Patient presents with right-sided facial swelling and pain, right ear pain, right sided throat pain that started about 3 days ago.  Reports some mild nasal congestion on the right side but denies coughing or fever.  Denies any known sick contacts.  Denies new trauma to any of the areas.  Has not taken any medications for symptoms.   Sore Throat    History reviewed. No pertinent past medical history.  There are no problems to display for this patient.   History reviewed. No pertinent surgical history.  OB History   No obstetric history on file.      Home Medications    Prior to Admission medications   Medication Sig Start Date End Date Taking? Authorizing Provider  amoxicillin-clavulanate (AUGMENTIN) 875-125 MG tablet Take 1 tablet by mouth every 12 (twelve) hours. 06/05/23  Yes Shatyra Becka, Rolly Salter E, FNP  predniSONE (DELTASONE) 20 MG tablet Take 2 tablets (40 mg total) by mouth daily for 5 days. 06/05/23 06/10/23 Yes Grey Schlauch, Acie Fredrickson, FNP  albuterol (PROVENTIL HFA;VENTOLIN HFA) 108 (90 Base) MCG/ACT inhaler Inhale 2 puffs into the lungs every 4 (four) hours as needed for wheezing or shortness of breath. 11/26/16   Cuthriell, Delorise Royals, PA-C    Family History History reviewed. No pertinent family history.  Social History Social History   Tobacco Use   Smoking status: Former    Current packs/day: 0.50    Types: Cigarettes   Smokeless tobacco: Never  Vaping Use   Vaping status: Every Day  Substance Use Topics   Alcohol use: Yes   Drug use: Yes    Types: Marijuana     Allergies   Patient has no known allergies.   Review of Systems Review of Systems Per HPI  Physical Exam Triage Vital Signs ED Triage Vitals  Encounter Vitals Group     BP  06/05/23 0850 (!) 138/92     Systolic BP Percentile --      Diastolic BP Percentile --      Pulse Rate 06/05/23 0850 73     Resp 06/05/23 0850 18     Temp 06/05/23 0850 98.3 F (36.8 C)     Temp Source 06/05/23 0850 Oral     SpO2 06/05/23 0850 97 %     Weight 06/05/23 0852 120 lb (54.4 kg)     Height 06/05/23 0852 4\' 11"  (1.499 m)     Head Circumference --      Peak Flow --      Pain Score 06/05/23 0851 4     Pain Loc --      Pain Education --      Exclude from Growth Chart --    No data found.  Updated Vital Signs BP (!) 138/92 (BP Location: Left Arm)   Pulse 73   Temp 98.3 F (36.8 C) (Oral)   Resp 18   Ht 4\' 11"  (1.499 m)   Wt 120 lb (54.4 kg)   LMP 05/23/2023   SpO2 97%   BMI 24.24 kg/m   Visual Acuity Right Eye Distance:   Left Eye Distance:   Bilateral Distance:    Right Eye Near:   Left Eye Near:    Bilateral Near:  Physical Exam Constitutional:      General: She is not in acute distress.    Appearance: Normal appearance. She is not toxic-appearing or diaphoretic.  HENT:     Head: Normocephalic and atraumatic.      Comments: Very mild swelling present underneath right eye overlying sinuses.    Right Ear: Ear canal normal. A middle ear effusion is present. Tympanic membrane is bulging. Tympanic membrane is not perforated or erythematous.     Left Ear: Ear canal normal. A middle ear effusion is present. Tympanic membrane is not perforated, erythematous or bulging.     Nose: Congestion present.     Mouth/Throat:     Mouth: Mucous membranes are moist.     Pharynx: Posterior oropharyngeal erythema present.  Eyes:     Extraocular Movements: Extraocular movements intact.     Conjunctiva/sclera: Conjunctivae normal.     Pupils: Pupils are equal, round, and reactive to light.  Cardiovascular:     Rate and Rhythm: Normal rate and regular rhythm.     Pulses: Normal pulses.     Heart sounds: Normal heart sounds.  Pulmonary:     Effort: Pulmonary effort  is normal. No respiratory distress.     Breath sounds: Normal breath sounds. No stridor. No wheezing, rhonchi or rales.  Abdominal:     General: Abdomen is flat. Bowel sounds are normal.     Palpations: Abdomen is soft.  Musculoskeletal:        General: Normal range of motion.     Cervical back: Normal range of motion.  Skin:    General: Skin is warm and dry.  Neurological:     General: No focal deficit present.     Mental Status: She is alert and oriented to person, place, and time. Mental status is at baseline.  Psychiatric:        Mood and Affect: Mood normal.        Behavior: Behavior normal.      UC Treatments / Results  Labs (all labs ordered are listed, but only abnormal results are displayed) Labs Reviewed  CULTURE, GROUP A STREP [facility])  POCT RAPID STREP A (OFFICE)    EKG   Radiology No results found.  Procedures Procedures (including critical care time)  Medications Ordered in UC Medications - No data to display  Initial Impression / Assessment and Plan / UC Course  I have reviewed the triage vital signs and the nursing notes.  Pertinent labs & imaging results that were available during my care of the patient were reviewed by me and considered in my medical decision making (see chart for details).     I suspect the patient's symptoms are due to sinus inflammation and fluid behind TMs.  Rapid strep is negative.  Throat culture pending.  Will opt to treat with Augmentin and prednisone to treat infection and inflammation.  No signs of facial cellulitis or dental infection on exam.  Advised patient to follow-up if any symptoms persist or worsen.  Patient verbalized understanding and was agreeable with plan. Final Clinical Impressions(s) / UC Diagnoses   Final diagnoses:  Fluid level behind tympanic membrane of both ears  Sore throat  Acute non-recurrent maxillary sinusitis     Discharge Instructions      Your rapid strep is negative.  Throat culture is  pending.  We will call if it is positive.  I suspect that you have a lot of inflammation in your sinuses and ears that are causing your discomfort.  I have prescribed an antibiotic and prednisone steroid to help alleviate this.  Please follow-up if any symptoms persist or worsen.     ED Prescriptions     Medication Sig Dispense Auth. Provider   amoxicillin-clavulanate (AUGMENTIN) 875-125 MG tablet Take 1 tablet by mouth every 12 (twelve) hours. 14 tablet Rex, [place] E, [place]   predniSONE (DELTASONE) 20 MG tablet Take 2 tablets (40 mg total) by mouth daily for 5 days. 10 tablet Gustavus Bryant, [place]      PDMP not reviewed this encounter.   Gustavus Bryant, [place] 06/05/23 0927    Gustavus Bryant, [place] 06/05/23 [phone]

## 2023-06-05 NOTE — Discharge Instructions (Signed)
Your rapid strep is negative.  Throat culture is pending.  We will call if it is positive.  I suspect that you have a lot of inflammation in your sinuses and ears that are causing your discomfort.  I have prescribed an antibiotic and prednisone steroid to help alleviate this.  Please follow-up if any symptoms persist or worsen.

## 2023-09-06 ENCOUNTER — Ambulatory Visit
Admission: EM | Admit: 2023-09-06 | Discharge: 2023-09-06 | Disposition: A | Discharge status: Home / Self Care | Payer: Medicaid Other | Attending: Emergency Medicine | Admitting: Emergency Medicine

## 2023-09-06 DIAGNOSIS — N898 Other specified noninflammatory disorders of vagina: Secondary | ICD-10-CM | POA: Insufficient documentation

## 2023-09-06 DIAGNOSIS — Z113 Encounter for screening for infections with a predominantly sexual mode of transmission: Secondary | ICD-10-CM | POA: Diagnosis not present

## 2023-09-06 LAB — POCT URINE PREGNANCY: Preg Test, Ur: NEGATIVE

## 2023-09-06 NOTE — Discharge Instructions (Addendum)
Your vaginal tests are pending.  If your test results are positive, we will call you.  You and your sexual partner(s) may require treatment at that time.  Do not have sexual activity for at least 7 days.    Follow up with your primary care provider or gynecologist if your symptoms are not improving.    

## 2023-09-06 NOTE — ED Triage Notes (Signed)
Patient to Urgent Care with complaints of vaginal discharge that started three weeks ago. Reports odor.   Reports initially thinking she had a yeast infection and used an otc med. Then started experiencing thin/ moderate amounts of clear discharge.  Denies any concerns for STDs.

## 2023-09-06 NOTE — ED Provider Notes (Signed)
[facility]    CSN: [id number] Arrival date & time: 09/06/23  1544      History   Chief Complaint Chief Complaint  Patient presents with   Vaginal Discharge    HPI Vanessa Gallegos is a 27 y.o. female.  Patient presents with 3-week history of intermittent vaginal discharge.  She treated this with OTC yeast infection medicine.  She is unsure if the discharge is improved because she had her menstrual cycle.  Once her cycle ended, the discharge returned.  She describes the discharge as thin and then thick; no odor.  She denies concern for STDs but would like to be tested.  She denies rash, fever, pelvic pain, dysuria, hematuria, abdominal pain, or other symptoms.  Patient was positive for trichomonas and bacterial vaginitis in February 2024.  The history is provided by the patient and medical records.    History reviewed. No pertinent past medical history.  There are no problems to display for this patient.   Past Surgical History:  Procedure Laterality Date   ABDOMINAL SURGERY     CESAREAN SECTION      OB History   No obstetric history on file.      Home Medications    Prior to Admission medications   Medication Sig Start Date End Date Taking? Authorizing Provider  albuterol (PROVENTIL HFA;VENTOLIN HFA) 108 (90 Base) MCG/ACT inhaler Inhale 2 puffs into the lungs every 4 (four) hours as needed for wheezing or shortness of breath. Patient not taking: Reported on 09/06/2023 11/26/16   Cuthriell, Delorise Royals, PA-C  amoxicillin-clavulanate (AUGMENTIN) 875-125 MG tablet Take 1 tablet by mouth every 12 (twelve) hours. Patient not taking: Reported on 09/06/2023 06/05/23   Gustavus Bryant, FNP    Family History History reviewed. No pertinent family history.  Social History Social History   Tobacco Use   Smoking status: Former    Current packs/day: 0.50    Types: Cigarettes   Smokeless tobacco: Never  Vaping Use   Vaping status: Every Day  Substance Use  Topics   Alcohol use: Yes   Drug use: Yes    Types: Marijuana     Allergies   Patient has no known allergies.   Review of Systems Review of Systems  Constitutional:  Negative for chills and fever.  Gastrointestinal:  Negative for abdominal pain, constipation, diarrhea, nausea and vomiting.  Genitourinary:  Positive for vaginal discharge. Negative for dysuria, hematuria and pelvic pain.  Skin:  Negative for color change and rash.     Physical Exam Triage Vital Signs ED Triage Vitals  Encounter Vitals Group     BP --      Systolic BP Percentile --      Diastolic BP Percentile --      Pulse Rate 09/06/23 1728 78     Resp 09/06/23 1728 16     Temp 09/06/23 1728 98 F (36.7 C)     Temp src --      SpO2 09/06/23 1728 98 %     Weight --      Height --      Head Circumference --      Peak Flow --      Pain Score 09/06/23 1724 0     Pain Loc --      Pain Education --      Exclude from Growth Chart --    No data found.  Updated Vital Signs BP 120/80   Pulse 78   Temp 98  F (36.7 C)   Resp 16   LMP 08/23/2023   SpO2 98%   Visual Acuity Right Eye Distance:   Left Eye Distance:   Bilateral Distance:    Right Eye Near:   Left Eye Near:    Bilateral Near:     Physical Exam Constitutional:      General: She is not in acute distress. HENT:     Mouth/Throat:     Mouth: Mucous membranes are moist.  Cardiovascular:     Rate and Rhythm: Normal rate and regular rhythm.  Pulmonary:     Effort: Pulmonary effort is normal. No respiratory distress.  Abdominal:     General: Bowel sounds are normal.     Palpations: Abdomen is soft.     Tenderness: There is no abdominal tenderness. There is no right CVA tenderness, left CVA tenderness, guarding or rebound.  Genitourinary:    Comments: Patient declines GU exam. Skin:    General: Skin is warm and dry.  Neurological:     Mental Status: She is alert.      UC Treatments / Results  Labs (all labs ordered are  listed, but only abnormal results are displayed) Labs Reviewed  POCT URINE PREGNANCY  CERVICOVAGINAL ANCILLARY ONLY    EKG   Radiology No results found.  Procedures Procedures (including critical care time)  Medications Ordered in UC Medications - No data to display  Initial Impression / Assessment and Plan / UC Course  I have reviewed the triage vital signs and the nursing notes.  Pertinent labs & imaging results that were available during my care of the patient were reviewed by me and considered in my medical decision making (see chart for details).   Vaginal discharge, STD screening.  Negative pregnancy test.  Patient obtained vaginal self swab for testing.  Discussed that we will call if test results are positive.  Discussed that she may require treatment at that time.  Discussed that sexual partner(s) may also require treatment.  Instructed patient to abstain from sexual activity for at least 7 days.  Instructed her to follow-up with her PCP or gynecologist if her symptoms are not improving.  Patient agrees to plan of care.    Final Clinical Impressions(s) / UC Diagnoses   Final diagnoses:  Vaginal discharge  Screening for STD (sexually transmitted disease)     Discharge Instructions       Your vaginal tests are pending.  If your test results are positive, we will call you.  You and your sexual partner(s) may require treatment at that time.  Do not have sexual activity for at least 7 days.    Follow-up with your primary care provider or gynecologist if your symptoms are not improving.        ED Prescriptions   None    PDMP not reviewed this encounter.   Mickie Bail, NP 09/06/23 1745

## 2023-09-07 ENCOUNTER — Telehealth: Payer: Self-pay

## 2023-09-07 LAB — CERVICOVAGINAL ANCILLARY ONLY
Bacterial Vaginitis (gardnerella): POSITIVE — AB
Candida Glabrata: NEGATIVE
Candida Vaginitis: NEGATIVE
Chlamydia: POSITIVE — AB
Comment: NEGATIVE
Comment: NEGATIVE
Comment: NEGATIVE
Comment: NEGATIVE
Comment: NEGATIVE
Comment: NORMAL
Neisseria Gonorrhea: NEGATIVE
Trichomonas: POSITIVE — AB

## 2023-09-07 MED ORDER — DOXYCYCLINE HYCLATE 100 MG PO CAPS: 100.0000 mg | ORAL_CAPSULE | Freq: Two times a day (BID) | ORAL | 0 refills | Status: AC

## 2023-09-07 MED ORDER — METRONIDAZOLE 500 MG PO TABS: 500.0000 mg | ORAL_TABLET | Freq: Two times a day (BID) | ORAL | 0 refills | Status: AC

## 2023-09-07 NOTE — Telephone Encounter (Signed)
Pt requires tx with Doxycycline and metronidazole.  Attempted to reach patient x1. LVM.  Rx sent to pharmacy on file.

## 2024-03-30 ENCOUNTER — Ambulatory Visit
Admission: EM | Admit: 2024-03-30 | Discharge: 2024-03-30 | Disposition: A | Discharge status: Home / Self Care | Payer: Self-pay | Attending: Emergency Medicine | Admitting: Emergency Medicine

## 2024-03-30 DIAGNOSIS — L550 Sunburn of first degree: Secondary | ICD-10-CM | POA: Insufficient documentation

## 2024-03-30 DIAGNOSIS — Z113 Encounter for screening for infections with a predominantly sexual mode of transmission: Secondary | ICD-10-CM | POA: Insufficient documentation

## 2024-03-30 MED ORDER — PREDNISONE 10 MG (21) PO TBPK: ORAL_TABLET | Freq: Every day | ORAL | 0 refills | Status: AC

## 2024-03-30 MED ORDER — ONDANSETRON 8 MG PO TBDP
8.0000 mg | ORAL_TABLET | Freq: Once | ORAL | Status: AC
  Administered 2024-03-30: 8 mg via ORAL

## 2024-03-30 MED ORDER — ONDANSETRON HCL 4 MG/2ML IJ SOLN: 4.0000 mg | Freq: Once | INTRAMUSCULAR | Status: DC

## 2024-03-30 MED ORDER — LIDOCAINE 5 % EX OINT: 1.0000 | TOPICAL_OINTMENT | CUTANEOUS | 0 refills | Status: AC | PRN

## 2024-03-30 MED ORDER — ONDANSETRON 4 MG PO TBDP: 4.0000 mg | ORAL_TABLET | Freq: Three times a day (TID) | ORAL | 0 refills | Status: AC | PRN

## 2024-03-30 NOTE — ED Triage Notes (Signed)
 Pt c/o overtanning  Pt states that she had spent 10 minutes in a tanning bed instead of 5 minutes.  Pt states that she has been very hot and her skin has turned red.  Pt states that she is in a constant 4/10 pain, but states that her breasts and butt hurt the most as she had never tanned them before.   Pt states that a few months ago she was seen for a STD check and tested positive, pt has taken the medication and asks for a recheck of chlamydia and BV

## 2024-03-30 NOTE — Discharge Instructions (Addendum)
 Your evaluated for your sunburn which is considered a first-degree burn, will improve over the next 1 to 2 weeks  Begin prednisone  every morning with food as directed to help reduce inflammation and help with pain, may take Tylenol  or use any topical medicines during treatment  May apply lidocaine cream as needed over the affected area to provide temporary relief, may continue use of aloe vera gel  Keep skin moisturized, as it heals you may experience tightness and itching, this is normal  You may follow-up for any concerns regarding healing  Labs pending 2-3 days, you will be contacted if positive for any sti and treatment will be sent to the pharmacy, you will have to return to the clinic if positive for gonorrhea to receive treatment   Please refrain from having sex until labs results, if positive please refrain from having sex until treatment complete and symptoms resolve   If positive for HIV, Syphilis, Chlamydia  gonorrhea or trichomoniasis please notify partner or partners so they may tested as well  Moving forward, it is recommended you use some form of protection against the transmission of sti infections  such as condoms or dental dams with each sexual encounter

## 2024-03-30 NOTE — ED Provider Notes (Signed)
 Vanessa Gallegos    CSN: [id number] Arrival date & time: 03/30/24  1520      History   Chief Complaint Chief Complaint  Patient presents with   Vomiting   Sunburn    HPI Vanessa Gallegos is a 28 y.o. female.   Patient presents for evaluation of a generalized sunburn occurring 1 day ago after laying in tanning bed for 10 minutes instead of typical 5.  Endorses worse to the breast into the back as these areas are typically not tanned like her extremities.  Has been experiencing intermittent lightheadedness, nausea without vomiting since occurrence.  Has attempted use of over-the-counter aloe vera without relief.  Denies drainage or fever.  Requesting routine STI testing, denying all symptoms or exposure.  History reviewed. No pertinent past medical history.  There are no active problems to display for this patient.   Past Surgical History:  Procedure Laterality Date   ABDOMINAL SURGERY     CESAREAN SECTION      OB History   No obstetric history on file.      Home Medications    Prior to Admission medications   Medication Sig Start Date End Date Taking? Authorizing Provider  lidocaine (XYLOCAINE) 5 % ointment Apply 1 Application topically as needed. 03/30/24  Yes Vedant Shehadeh R, NP  ondansetron  (ZOFRAN -ODT) 4 MG disintegrating tablet Take 1 tablet (4 mg total) by mouth every 8 (eight) hours as needed. 03/30/24  Yes Jakson Delpilar R, NP  predniSONE  (STERAPRED UNI-PAK 21 TAB) 10 MG (21) TBPK tablet Take by mouth daily. Take 6 tabs by mouth daily  for 1 days, then 5 tabs for 1 days, then 4 tabs for 1 days, then 3 tabs for 1 days, 2 tabs for 1 days, then 1 tab by mouth daily for 1 days 03/30/24  Yes Shalae Belmonte R, NP  albuterol  (PROVENTIL  HFA;VENTOLIN  HFA) 108 (90 Base) MCG/ACT inhaler Inhale 2 puffs into the lungs every 4 (four) hours as needed for wheezing or shortness of breath. Patient not taking: Reported on 09/06/2023 11/26/16   Cuthriell, Jonathan D, PA-C   amoxicillin -clavulanate (AUGMENTIN ) 875-125 MG tablet Take 1 tablet by mouth every 12 (twelve) hours. Patient not taking: Reported on 09/06/2023 06/05/23   Dodson Freestone, FNP  doxycycline  (VIBRAMYCIN ) 100 MG capsule Take 1 capsule (100 mg total) by mouth 2 (two) times daily. 09/07/23   LampteyDonley Furth, MD    Family History History reviewed. No pertinent family history.  Social History Social History   Tobacco Use   Smoking status: Former    Current packs/day: 0.50    Types: Cigarettes   Smokeless tobacco: Never  Vaping Use   Vaping status: Every Day  Substance Use Topics   Alcohol use: Yes   Drug use: Not Currently    Types: Marijuana     Allergies   Patient has no known allergies.   Review of Systems Review of Systems   Physical Exam Triage Vital Signs ED Triage Vitals  Encounter Vitals Group     BP 03/30/24 1531 127/86     Systolic BP Percentile --      Diastolic BP Percentile --      Pulse Rate 03/30/24 1531 94     Resp --      Temp 03/30/24 1531 98.1 F (36.7 C)     Temp Source 03/30/24 1531 Oral     SpO2 03/30/24 1531 98 %     Weight --      Height --  Head Circumference --      Peak Flow --      Pain Score 03/30/24 1530 5     Pain Loc --      Pain Education --      Exclude from Growth Chart --    No data found.  Updated Vital Signs BP 127/86 (BP Location: Left Arm)   Pulse 94   Temp 98.1 F (36.7 C) (Oral)   LMP 03/09/2024   SpO2 98%   Visual Acuity Right Eye Distance:   Left Eye Distance:   Bilateral Distance:    Right Eye Near:   Left Eye Near:    Bilateral Near:     Physical Exam Constitutional:      Appearance: Normal appearance.  Eyes:     Extraocular Movements: Extraocular movements intact.  Pulmonary:     Effort: Pulmonary effort is normal.  Skin:    Comments: Erythematous first-degree burn present to the trunk of the body  Neurological:     Mental Status: She is alert and oriented to person, place, and time.  Mental status is at baseline.      UC Treatments / Results  Labs (all labs ordered are listed, but only abnormal results are displayed) Labs Reviewed  RPR  HIV ANTIBODY (ROUTINE TESTING W REFLEX)  CERVICOVAGINAL ANCILLARY ONLY    EKG   Radiology No results found.  Procedures Procedures (including critical care time)  Medications Ordered in UC Medications  ondansetron  (ZOFRAN -ODT) disintegrating tablet 8 mg (8 mg Oral Given 03/30/24 1543)    Initial Impression / Assessment and Plan / UC Course  I have reviewed the triage vital signs and the nursing notes.  Pertinent labs & imaging results that were available during my care of the patient were reviewed by me and considered in my medical decision making (see chart for details).  Sunburn of first-degree, routine screening for STI  Presentation consistent with first-degree burn, no known cause, prescribed prednisone  and lidocaine cream, recommended supportive care through our lip area, moisturizing of the skin and wrist, Zofran  given in clinic and prescribed for home use, advised follow-up for signs of infection  STI labs pending will treat per protocol, advised abstinence until lab results, and/or treatment is complete, advised condom use during all sexual encounters moving, may follow-up with urgent care as needed  Final Clinical Impressions(s) / UC Diagnoses   Final diagnoses:  Sunburn of first degree  Routine screening for STI (sexually transmitted infection)   Discharge Instructions      Your evaluated for your sunburn which is considered a first-degree burn, will improve over the next 1 to 2 weeks  Begin prednisone  every morning with food as directed to help reduce inflammation and help with pain, may take Tylenol  or use any topical medicines during treatment  May apply lidocaine cream as needed over the affected area to provide temporary relief, may continue use of aloe vera gel  Keep skin moisturized, as it heals  you may experience tightness and itching, this is normal  You may follow-up for any concerns regarding healing  Labs pending 2-3 days, you will be contacted if positive for any sti and treatment will be sent to the pharmacy, you will have to return to the clinic if positive for gonorrhea to receive treatment   Please refrain from having sex until labs results, if positive please refrain from having sex until treatment complete and symptoms resolve   If positive for HIV, Syphilis, Chlamydia  gonorrhea or trichomoniasis  please notify partner or partners so they may tested as well  Moving forward, it is recommended you use some form of protection against the transmission of sti infections  such as condoms or dental dams with each sexual encounter    ED Prescriptions     Medication Sig Dispense Auth. Provider   predniSONE  (STERAPRED UNI-PAK 21 TAB) 10 MG (21) TBPK tablet Take by mouth daily. Take 6 tabs by mouth daily  for 1 days, then 5 tabs for 1 days, then 4 tabs for 1 days, then 3 tabs for 1 days, 2 tabs for 1 days, then 1 tab by mouth daily for 1 days 21 tablet Armanda Forand R, NP   lidocaine (XYLOCAINE) 5 % ointment Apply 1 Application topically as needed. 35.44 g Alayne Allis R, NP   ondansetron  (ZOFRAN -ODT) 4 MG disintegrating tablet Take 1 tablet (4 mg total) by mouth every 8 (eight) hours as needed. 20 tablet Meng Winterton R, NP      PDMP not reviewed this encounter.   Reena Canning, NP 03/30/24 1719

## 2024-03-31 LAB — HIV ANTIBODY (ROUTINE TESTING W REFLEX): HIV Screen 4th Generation wRfx: NONREACTIVE

## 2024-03-31 LAB — RPR: RPR Ser Ql: NONREACTIVE

## 2024-04-02 LAB — CERVICOVAGINAL ANCILLARY ONLY
Bacterial Vaginitis (gardnerella): POSITIVE — AB
Candida Glabrata: NEGATIVE
Candida Vaginitis: NEGATIVE
Chlamydia: NEGATIVE
Comment: NEGATIVE
Comment: NEGATIVE
Comment: NEGATIVE
Comment: NEGATIVE
Comment: NEGATIVE
Comment: NORMAL
Neisseria Gonorrhea: NEGATIVE
Trichomonas: NEGATIVE

## 2024-04-04 ENCOUNTER — Ambulatory Visit (HOSPITAL_COMMUNITY): Payer: Self-pay
